# Patient Record
Sex: Female | Born: 1995 | Race: White | Hispanic: Yes | Marital: Single | Smoking: Never smoker
Health system: Southern US, Community
[De-identification: ages and names within clinical notes are randomized; demographics above are authoritative.]

## PROBLEM LIST (undated history)

## (undated) ENCOUNTER — Inpatient Hospital Stay (HOSPITAL_COMMUNITY): Payer: Self-pay

## (undated) DIAGNOSIS — N12 Tubulo-interstitial nephritis, not specified as acute or chronic: Secondary | ICD-10-CM

## (undated) HISTORY — DX: Tubulo-interstitial nephritis, not specified as acute or chronic: N12

---
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
## 2015-04-30 LAB — OB RESULTS CONSOLE ABO/RH: RH TYPE: POSITIVE

## 2015-04-30 LAB — OB RESULTS CONSOLE RUBELLA ANTIBODY, IGM: Rubella: IMMUNE

## 2015-04-30 LAB — OB RESULTS CONSOLE ANTIBODY SCREEN: ANTIBODY SCREEN: NEGATIVE

## 2015-04-30 LAB — OB RESULTS CONSOLE GC/CHLAMYDIA
Chlamydia: NEGATIVE
GC PROBE AMP, GENITAL: NEGATIVE

## 2015-04-30 LAB — OB RESULTS CONSOLE RPR: RPR: NONREACTIVE

## 2015-04-30 LAB — OB RESULTS CONSOLE HEPATITIS B SURFACE ANTIGEN: Hepatitis B Surface Ag: NEGATIVE

## 2015-04-30 LAB — OB RESULTS CONSOLE HIV ANTIBODY (ROUTINE TESTING): HIV: NONREACTIVE

## 2015-05-03 ENCOUNTER — Other Ambulatory Visit (HOSPITAL_COMMUNITY): Payer: Self-pay | Admitting: Nurse Practitioner

## 2015-05-03 DIAGNOSIS — Z3689 Encounter for other specified antenatal screening: Secondary | ICD-10-CM

## 2015-05-03 DIAGNOSIS — Z3A19 19 weeks gestation of pregnancy: Secondary | ICD-10-CM

## 2015-05-16 ENCOUNTER — Other Ambulatory Visit (HOSPITAL_COMMUNITY): Payer: Self-pay | Admitting: Nurse Practitioner

## 2015-05-16 ENCOUNTER — Ambulatory Visit (HOSPITAL_COMMUNITY)
Admission: RE | Admit: 2015-05-16 | Discharge: 2015-05-16 | Disposition: A | Discharge status: Home / Self Care | Payer: Self-pay | Source: Ambulatory Visit | Attending: Nurse Practitioner | Admitting: Nurse Practitioner

## 2015-05-16 ENCOUNTER — Encounter (HOSPITAL_COMMUNITY): Payer: Self-pay

## 2015-05-16 DIAGNOSIS — Z3491 Encounter for supervision of normal pregnancy, unspecified, first trimester: Secondary | ICD-10-CM

## 2015-05-16 DIAGNOSIS — Z3A19 19 weeks gestation of pregnancy: Secondary | ICD-10-CM

## 2015-05-16 DIAGNOSIS — Z3689 Encounter for other specified antenatal screening: Secondary | ICD-10-CM

## 2015-05-16 DIAGNOSIS — Z3A13 13 weeks gestation of pregnancy: Secondary | ICD-10-CM | POA: Insufficient documentation

## 2015-07-10 IMAGING — US US OB COMP +14 WK
1 series · 12 of 28 positions shown · non-contrast
Comparison: none

OBSTETRICS REPORT
                      (Signed Final 09/24/2013 [DATE])

Service(s) Provided
 US OB COMP + 14 WK                                    76805.1
Indications
 Unsure of LMP;  Establish Gestational [AGE]
 Acute Pyelonephritis
 Basic anatomic survey
Fetal Evaluation
 Num Of Fetuses:    1
 Fetal Heart Rate:  136                         bpm
 Cardiac Activity:  Observed
 Presentation:      Cephalic
 Placenta:          Anterior, above cervical os
 P. Cord            Visualized
 Insertion:
 Amniotic Fluid
 AFI FV:      Subjectively within normal limits
                                             Larg Pckt:     5.7  cm
Biometry
 BPD:     43.4  mm    G. Age:   19w 1d                CI:        73.38   70 - 86
                                                      FL/HC:      16.8   16.1 -
 HC:       161  mm    G. Age:   18w 6d       45  %    HC/AC:      1.18   1.09 -
 AC:     135.9  mm    G. Age:   19w 0d       52  %    FL/BPD:
 FL:      27.1  mm    G. Age:   18w 2d       23  %    FL/AC:      19.9   20 - 24
 HUM:       26  mm    G. Age:   18w 1d       32  %
 CER:       20  mm    G. Age:   19w 0d       56  %
 NFT:     2.97  mm
 Est. FW:     254  gm      0 lb 9 oz     42  %
Gestational Age
 U/S Today:     18w 6d                                        EDD:   02/19/14
 Best:          18w 6d    Det. By:   U/S (09/24/13)           EDD:   02/19/14
Anatomy
 Cranium:          Appears normal         Aortic Arch:      Appears normal
 Fetal Cavum:      Appears normal         Ductal Arch:      Not well visualized
 Ventricles:       Appears normal         Diaphragm:        Appears normal
 Choroid Plexus:   Appears normal         Stomach:          Appears normal, left
                                                            sided
 Cerebellum:       Appears normal         Abdomen:          Appears normal
 Posterior Fossa:  Appears normal         Abdominal Wall:   Appears nml (cord
                                                            insert, abd wall)
 Nuchal Fold:      Appears normal         Cord Vessels:     Appears normal (3
                                                            vessel cord)
 Face:             Appears normal         Kidneys:          Appear normal
                   (orbits and profile)
 Lips:             Appears normal         Bladder:          Appears normal
 Palate:           Not well visualized    Spine:            Appears normal
 Heart:            Appears normal         Lower             Visualized
                   (4CH, axis, and        Extremities:
                   situs)
 RVOT:             Not well visualized    Upper             Visualized
                                          Extremities:
 LVOT:             Not well visualized
 Other:  Gender not well visualized. Heels, Left 5th digit, and Nasal bone
         visualized.
Targeted Anatomy
 Fetal Central Nervous System
 Lat. Ventricles:  7.8                    Cisterna Magna:
Cervix Uterus Adnexa
 Cervical Length:   2.7       cm
 Cervix:       Normal appearance by transabdominal scan.
 Left Ovary:   Size(cm) L: 3.38 x W: 2.05 x H: 1.93  Volume(cc): 7
 Right Ovary:  Size(cm) L: 3.68 x W: 2.41 x H: 1.88  Volume(cc):
 Adnexa:     No abnormality visualized.
Impression
INDICATION: 17 yr old G1P0 with unknown dating and
 pyelonephritis for fetal ultrasound. Remote read.

[Series 1: us ob comp +14 wk · 12 of 76 slices shown]
[im 3/76]
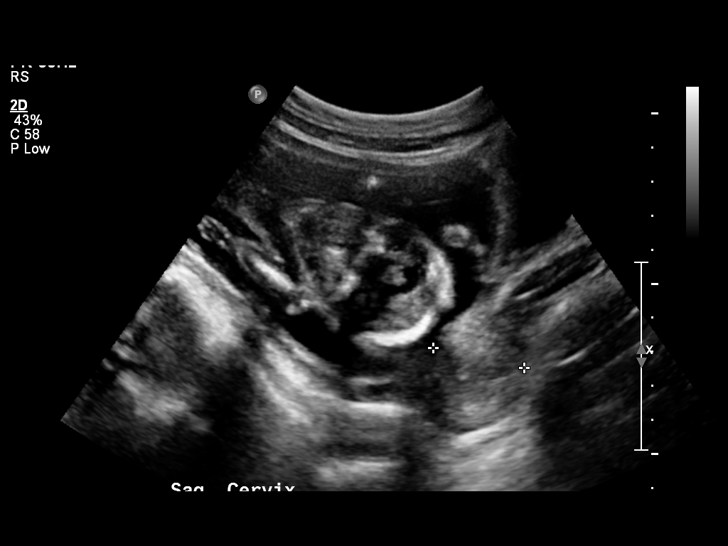
[im 9/76]
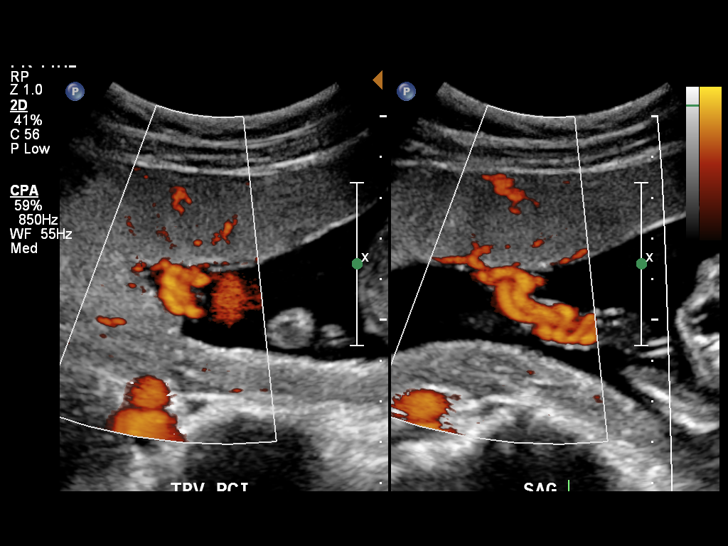
[im 14/76]
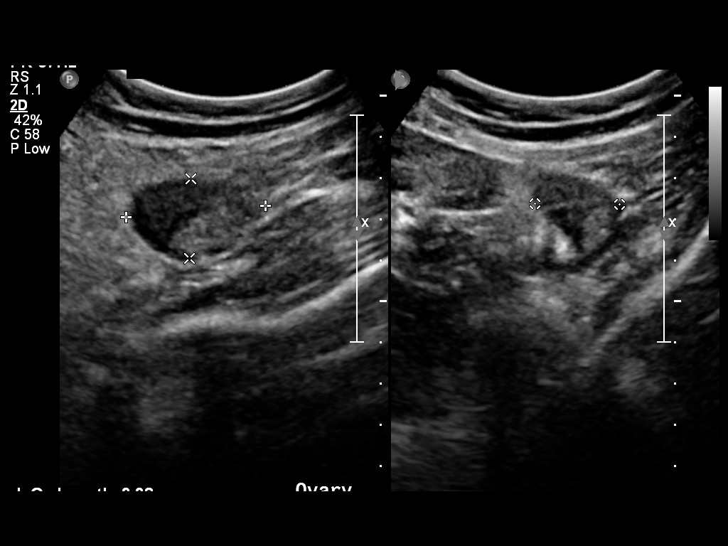
[im 23/76]
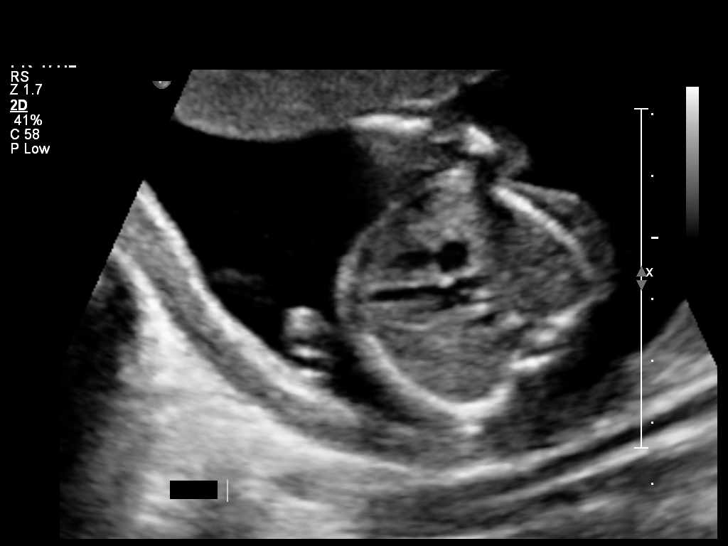
[im 28/76]
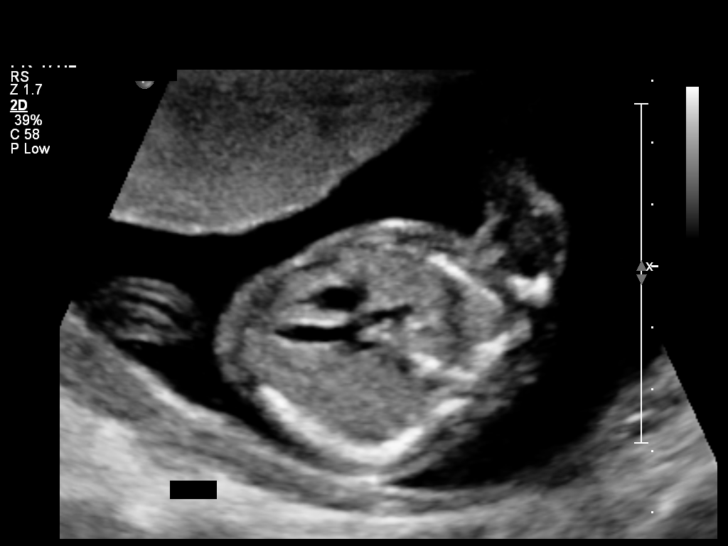
[im 34/76]
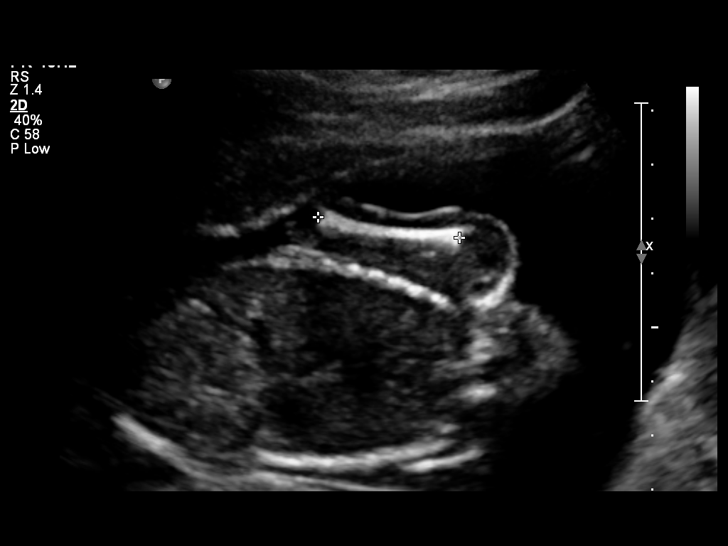
[im 42/76]
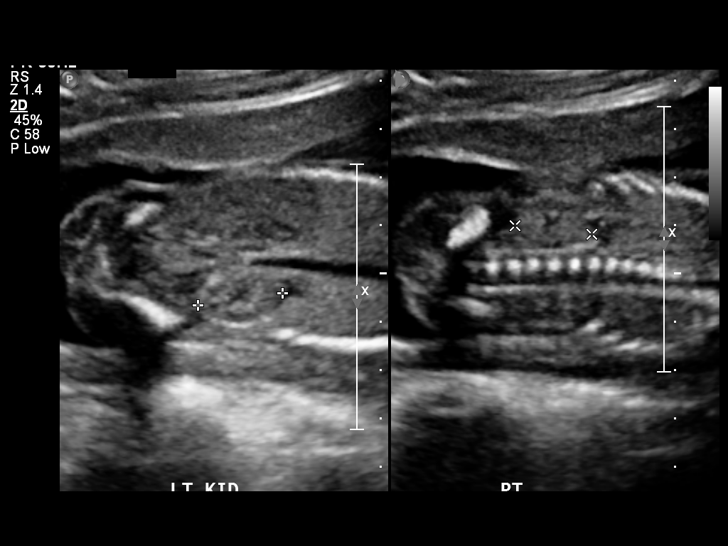
[im 48/76]
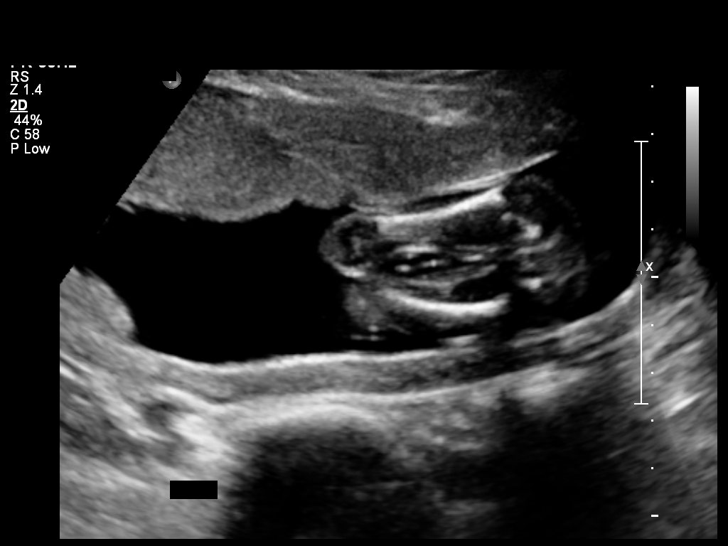
[im 53/76]
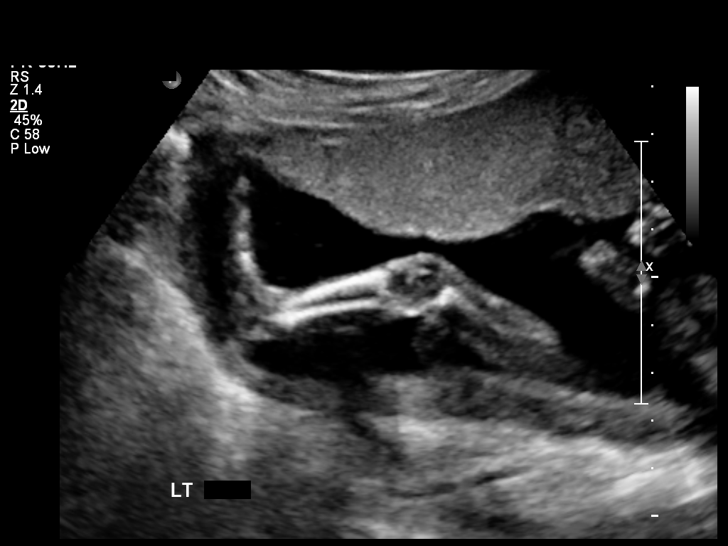
[im 62/76]
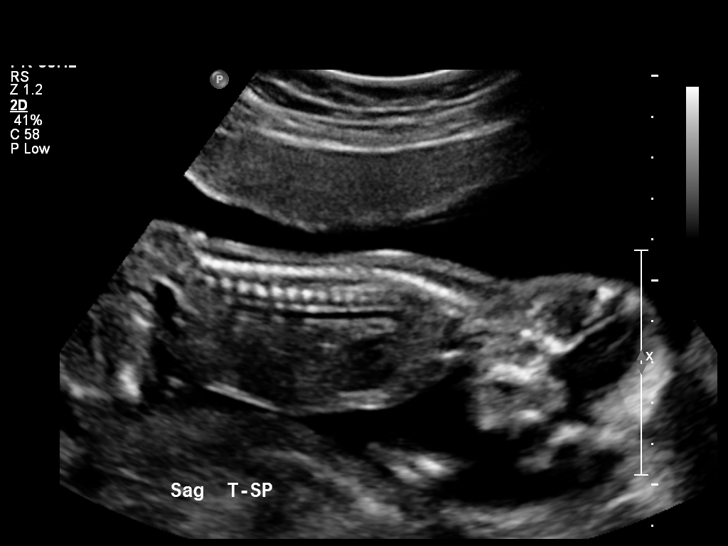
[im 67/76]
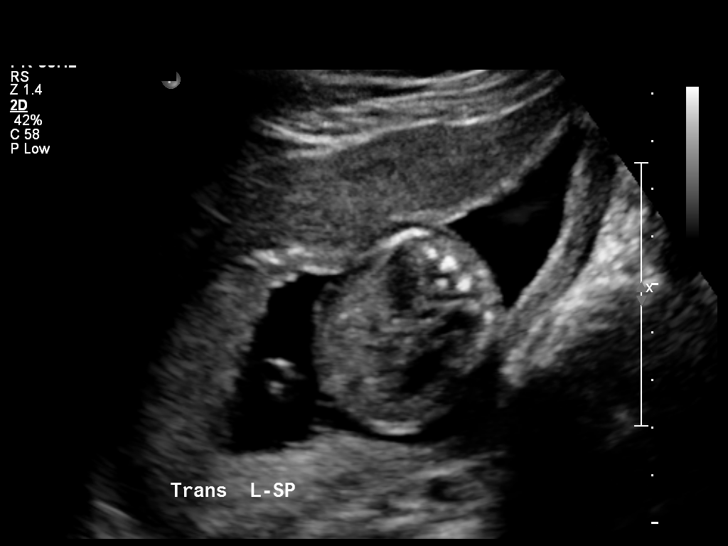
[im 73/76]
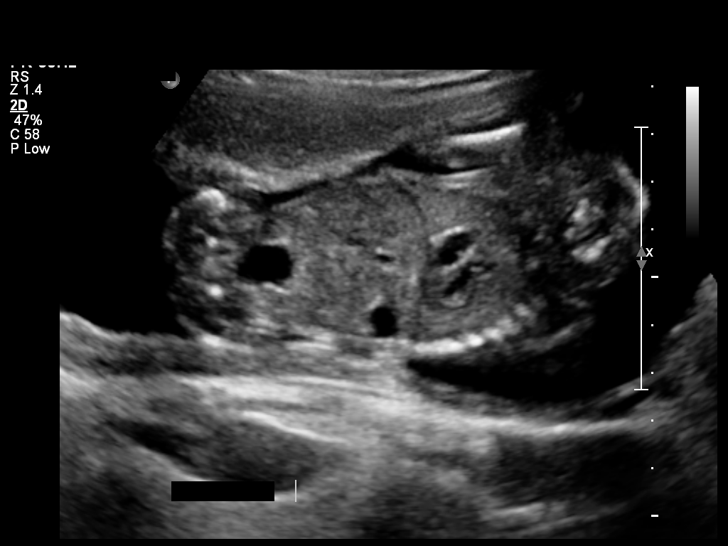

[12 of 28 positions shown; findings below may reference images not displayed]

FINDINGS: 1. Single intrauterine pregnancy.
 2. Fetal biometry is consistent with 52w3d.
 3. Anterior placenta without evidence of previa.
 4. Normal amniotic fluid volume.
 5. Normal transabdominal cervical length; although length is
 on the low end of normal.
 6. The views of the palate and heart are limited.
 7. The remainder of the limited anatomy survey is normal.
Recommendations

 1. Based on today's ultrasound recommend using an
 estimated due date of 02/19/14.
 [DATE]. Limited anatomy survey:
 - recommend follow up in 3-4 weeks to complete anatomic
 survey
 3. Slightly decreased cervical length:
 - recommend obtain transvaginal cervical length
 4. Pyelonephritis:
 - management per primary OB team

## 2015-07-22 NOTE — L&D Delivery Note (Signed)
Delivery Note Admitted in active labor and rapidly progressed to 10cm w/ bbow. SROM clear fluid when began to push. At 2:45 AM a viable female was delivered via Vaginal, Spontaneous Delivery (Presentation: Left Occiput Anterior) w/ loose nuchal- delivered through and immediately reduced after birth. ~<30sec shoulder dystocia resolved by McRoberts and rotation of posterior shoulder to oblique with subsequent delivery of anterior (Rt) shoulder and remainder of body.  APGAR: 8, 9; weight: pending at time of note. Infant w/ spontaneous cry/respiration placed directly on mom's abdomen for bonding/skin-to-skin. Delayed cord clamping, then cord clamped x 2, and cut by fob.  Appears to be moving bilateral arms symmetrically and w/o difficulty.    Placenta status: Intact, Spontaneous.  Cord: 3 vessels with the following complications: None.  Cord pH: not collected  Anesthesia: Local Episiotomy: None Lacerations: Rt deep/long Periurethral Suture Repair: 4.0 vicryl Est. Blood Loss (mL): 50  Mom to postpartum.  Baby to Couplet care / Skin to Skin. Plans to pump and bottlefeed, depo for contraception, no circ  Marge DuncansBooker, Peighton Mehra Randall 11/18/2015, 3:13 AM

## 2015-09-03 IMAGING — US US OB FOLLOW-UP
2 series · 12 of 28 positions shown · non-contrast
Comparison: none

OBSTETRICS REPORT
                      (Signed Final 11/18/2013 [DATE])

Service(s) Provided
 US OB FOLLOW UP                                       76816.1
Indications
 Follow-up incomplete fetal anatomic evaluation
Fetal Evaluation
 Num Of Fetuses:    1
 Fetal Heart Rate:  150                          bpm
 Cardiac Activity:  Observed
 Presentation:      Cephalic
 Placenta:          Anterior, above cervical os
 P. Cord            Previously Visualized
 Insertion:
 Amniotic Fluid
 AFI FV:      Subjectively within normal limits
                                             Larg Pckt:     4.1  cm
Biometry
 BPD:     64.9  mm     G. Age:  26w 2d                CI:         73.3   70 - 86
                                                      FL/HC:      19.1   18.6 -
 HC:     240.9  mm     G. Age:  26w 1d       11  %    HC/AC:      1.10   1.05 -
 AC:     219.7  mm     G. Age:  26w 3d       33  %    FL/BPD:     70.9   71 - 87
 FL:        46  mm     G. Age:  25w 2d        7  %    FL/AC:      20.9   20 - 24
 HUM:     42.9  mm     G. Age:  25w 5d       22  %
 Est. FW:     876  gm    1 lb 15 oz      37  %
Gestational Age
 U/S Today:     26w 0d                                        EDD:   02/24/14
 Best:          26w 5d     Det. By:  U/S (09/24/13)           EDD:   02/19/14
Anatomy
 Cranium:          Appears normal         Aortic Arch:      Previously seen
 Fetal Cavum:      Previously seen        Ductal Arch:      Not well visualized
 Ventricles:       Appears normal         Diaphragm:        Previously seen
 Choroid Plexus:   Previously seen        Stomach:          Appears normal, left
                                                            sided
 Cerebellum:       Previously seen        Abdomen:          Appears normal
 Posterior Fossa:  Previously seen        Abdominal Wall:   Previously seen
 Nuchal Fold:      Not applicable (>20    Cord Vessels:     Previously seen
                   wks GA)
 Face:             Orbits and profile     Kidneys:          Appear normal
                   previously seen
 Lips:             Previously seen        Bladder:          Appears normal
 Heart:            Appears normal         Spine:            Previously seen
                   (4CH, axis, and
                   situs)
 RVOT:             Appears normal         Lower             Previously seen
                                          Extremities:
 LVOT:             Appears normal         Upper             Previously seen
 Other:  Fetus appears to be a female.. Heels, Left 5th digit, and Nasal bone
         previously visualized.
Targeted Anatomy
 Fetal Central Nervous System
 Lat. Ventricles:
Cervix Uterus Adnexa
 Cervical Length:    3.04     cm
 Cervix:       Normal appearance by transabdominal scan.
 Uterus:       No abnormality visualized.
 Left Ovary:    Not visualized.
 Right Ovary:   Not visualized.
 Adnexa:     No abnormality No adnexal mass visualized.
Impression
INDICATION: 17 yr old G1P0 at 15w3d for follow up attempt to
 complete fetal survey. Remote read.

[Series 1: us ob follow up · 11 of 44 slices shown (1 of 2)]
[im 2/44]
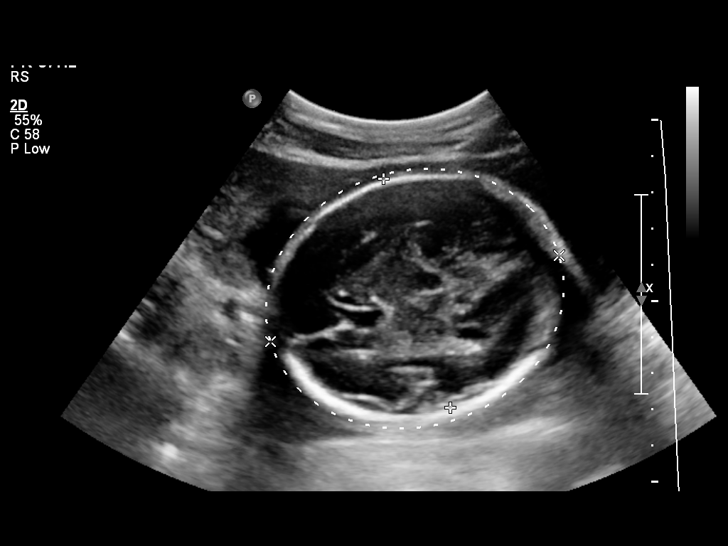
[im 6/44]
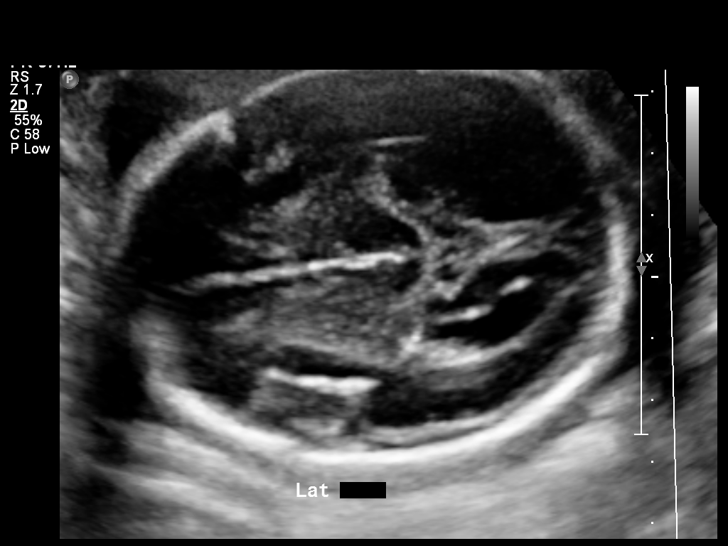
[im 9/44]
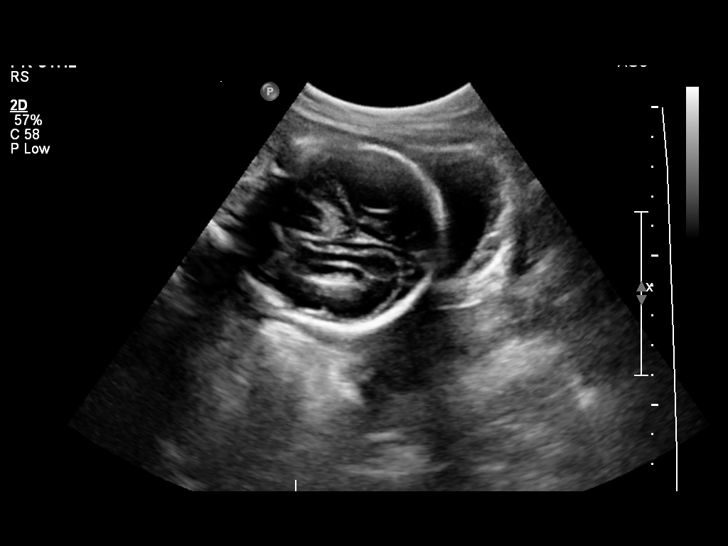
[im 14/44]
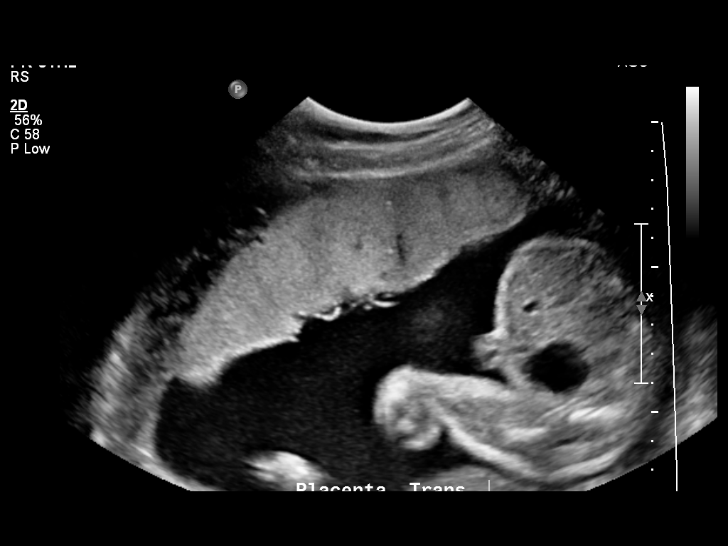
[im 18/44]
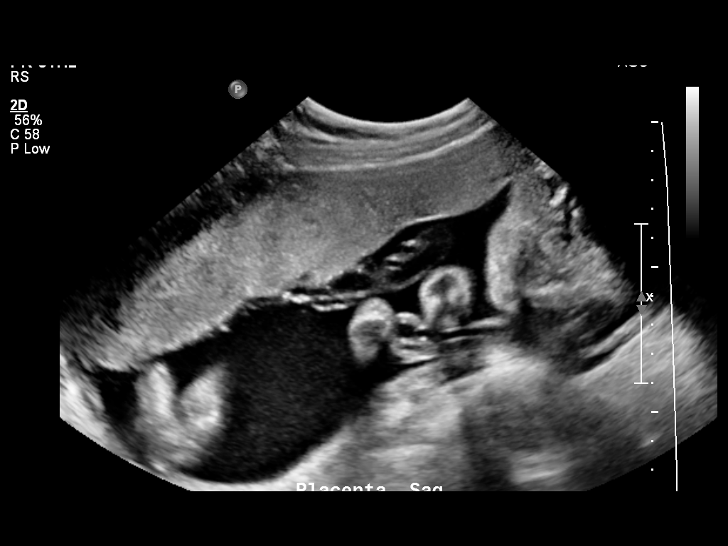
[im 21/44]
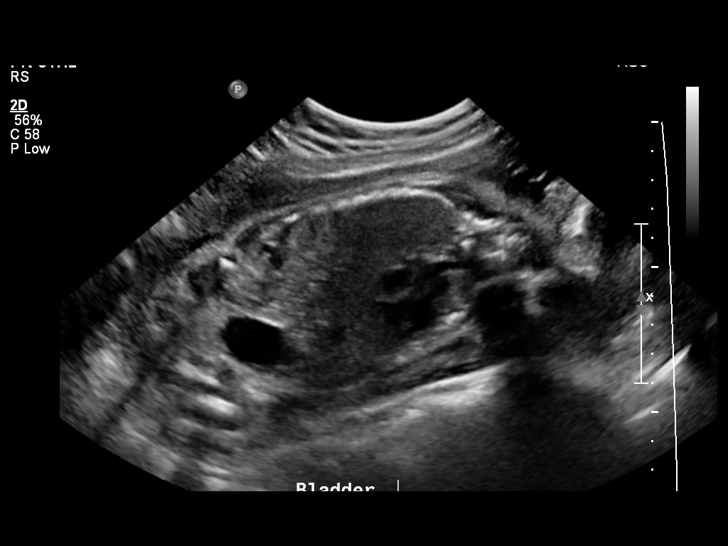
[im 26/44]
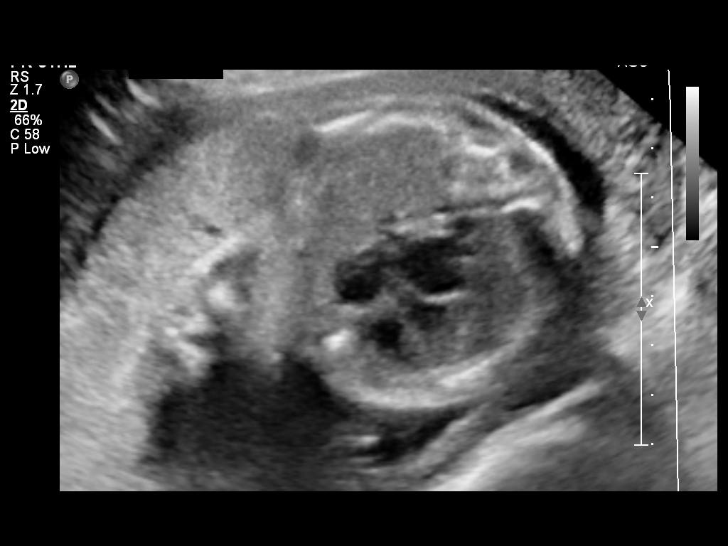
[im 30/44]
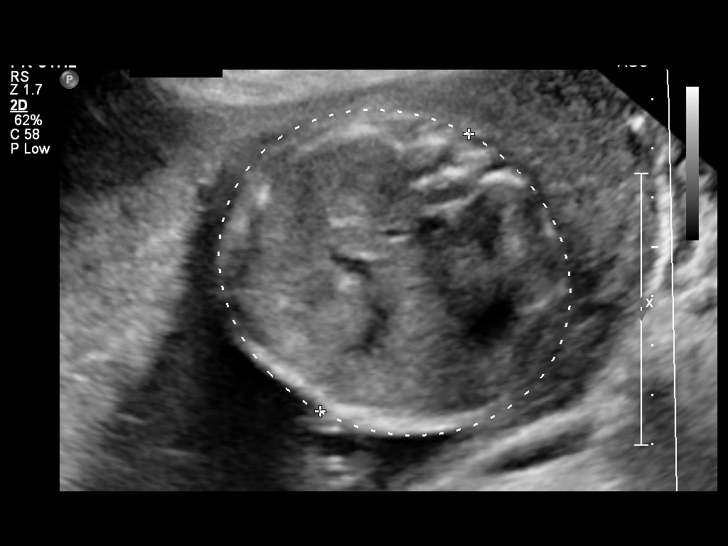
[im 33/44]
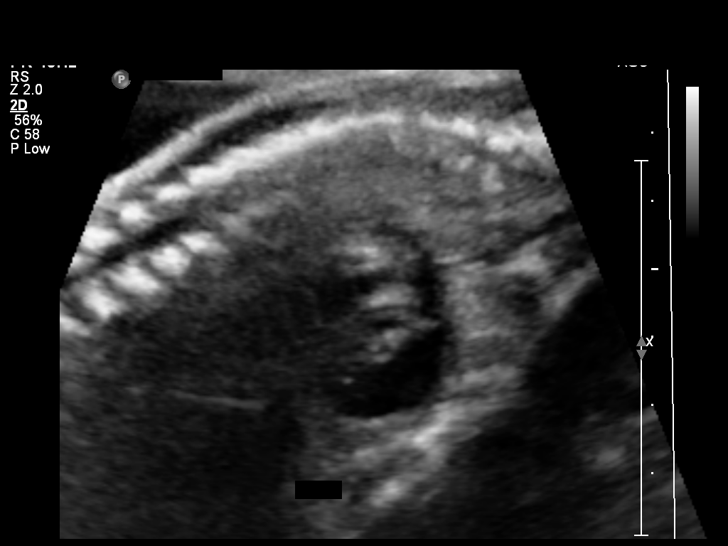
[im 38/44]
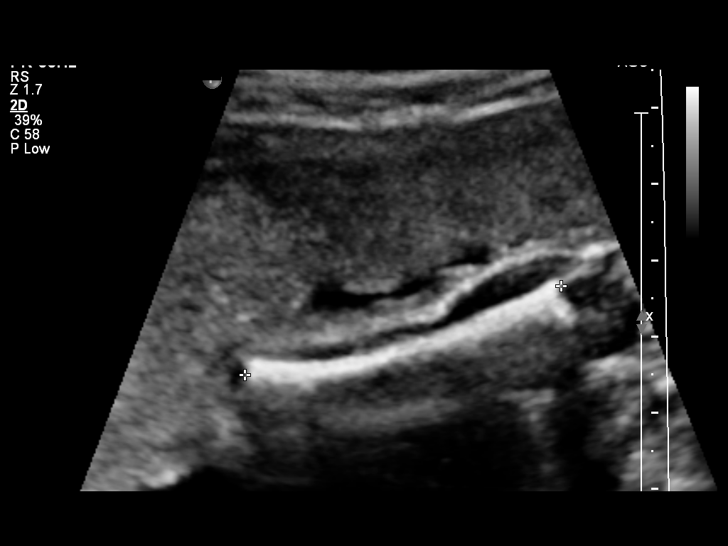
[im 42/44]
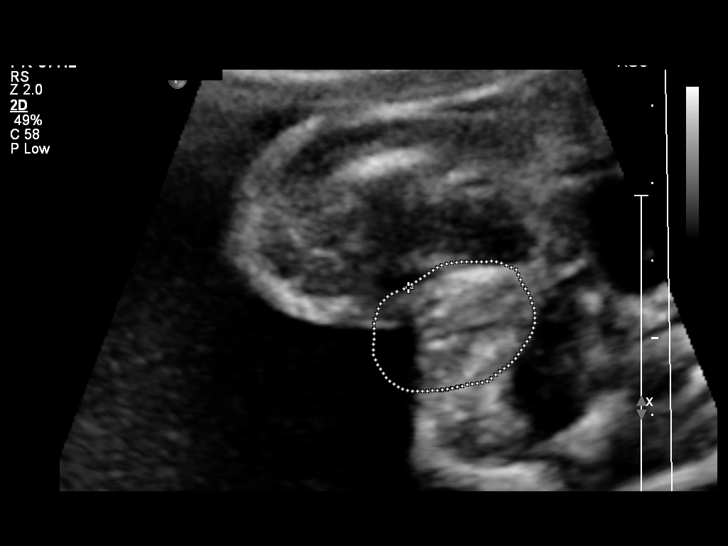

[Series 1: us ob follow up · 1 of 3 slices shown (2 of 2)]
[im 1/3]
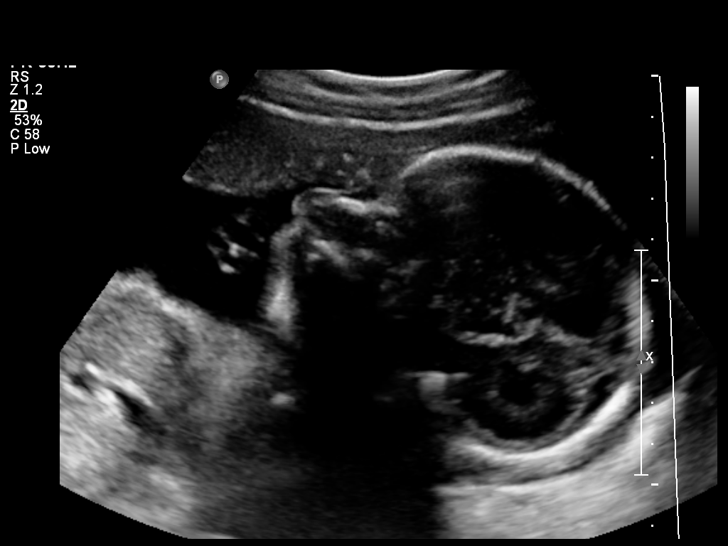

[12 of 28 positions shown; findings below may reference images not displayed]

FINDINGS: 1. Active single intrauterine pregnancy in cephalic
 presentation
 2. EFW 37th% with appropriate interval growth
 3. Anterior placenta without evidence of previa.
 4. Today's images effectively complete essential components
 of fetal survey without apparent dysmorphic features
Recommendations

 Follow up ultrasounds as clinically indicated.

## 2015-10-26 LAB — OB RESULTS CONSOLE GBS: STREP GROUP B AG: NEGATIVE

## 2015-11-18 ENCOUNTER — Inpatient Hospital Stay (HOSPITAL_COMMUNITY)
Admission: AD | Admit: 2015-11-18 | Discharge: 2015-11-19 | DRG: 775 | Disposition: A | Discharge status: Home / Self Care | Payer: Medicaid Other | Source: Ambulatory Visit | Attending: Obstetrics and Gynecology | Admitting: Obstetrics and Gynecology

## 2015-11-18 ENCOUNTER — Encounter (HOSPITAL_COMMUNITY): Payer: Self-pay | Admitting: *Deleted

## 2015-11-18 DIAGNOSIS — Z3A40 40 weeks gestation of pregnancy: Secondary | ICD-10-CM | POA: Diagnosis not present

## 2015-11-18 DIAGNOSIS — IMO0001 Reserved for inherently not codable concepts without codable children: Secondary | ICD-10-CM

## 2015-11-18 LAB — CBC
HEMATOCRIT: 33.3 % — AB (ref 36.0–46.0)
HEMOGLOBIN: 11 g/dL — AB (ref 12.0–15.0)
MCH: 23.8 pg — ABNORMAL LOW (ref 26.0–34.0)
MCHC: 33 g/dL (ref 30.0–36.0)
MCV: 72.1 fL — ABNORMAL LOW (ref 78.0–100.0)
Platelets: 222 10*3/uL (ref 150–400)
RBC: 4.62 MIL/uL (ref 3.87–5.11)
RDW: 16.9 % — ABNORMAL HIGH (ref 11.5–15.5)
WBC: 8.4 10*3/uL (ref 4.0–10.5)

## 2015-11-18 LAB — TYPE AND SCREEN
ABO/RH(D): O POS
ANTIBODY SCREEN: NEGATIVE

## 2015-11-18 LAB — HIV ANTIBODY (ROUTINE TESTING W REFLEX): HIV SCREEN 4TH GENERATION: NONREACTIVE

## 2015-11-18 LAB — ABO/RH: ABO/RH(D): O POS

## 2015-11-18 LAB — RPR: RPR Ser Ql: NONREACTIVE

## 2015-11-18 MED ORDER — ACETAMINOPHEN 325 MG PO TABS: 650.0000 mg | ORAL_TABLET | ORAL | Status: DC | PRN

## 2015-11-18 MED ORDER — WITCH HAZEL-GLYCERIN EX PADS: 1.0000 "application " | MEDICATED_PAD | CUTANEOUS | Status: DC | PRN

## 2015-11-18 MED ORDER — PRENATAL MULTIVITAMIN CH
1.0000 | ORAL_TABLET | Freq: Every day | ORAL | Status: DC
  Administered 2015-11-18: 1 via ORAL
  Filled 2015-11-18: qty 1

## 2015-11-18 MED ORDER — SENNOSIDES-DOCUSATE SODIUM 8.6-50 MG PO TABS
2.0000 | ORAL_TABLET | ORAL | Status: DC
  Administered 2015-11-18: 2 via ORAL

## 2015-11-18 MED ORDER — LACTATED RINGERS IV SOLN
INTRAVENOUS | Status: DC
  Administered 2015-11-18: 03:00:00 via INTRAVENOUS

## 2015-11-18 MED ORDER — SIMETHICONE 80 MG PO CHEW: 80.0000 mg | CHEWABLE_TABLET | ORAL | Status: DC | PRN

## 2015-11-18 MED ORDER — OXYTOCIN 10 UNIT/ML IJ SOLN
INTRAVENOUS | Status: AC
  Filled 2015-11-18: qty 4

## 2015-11-18 MED ORDER — BENZOCAINE-MENTHOL 20-0.5 % EX AERO
1.0000 | INHALATION_SPRAY | CUTANEOUS | Status: DC | PRN
Start: 2015-11-18 — End: 2015-11-19

## 2015-11-18 MED ORDER — OXYTOCIN BOLUS FROM INFUSION
500.0000 mL | INTRAVENOUS | Status: DC
Start: 2015-11-18 — End: 2015-11-19
  Administered 2015-11-18: 500 mL via INTRAVENOUS

## 2015-11-18 MED ORDER — LIDOCAINE HCL (PF) 1 % IJ SOLN
30.0000 mL | INTRAMUSCULAR | Status: DC | PRN
  Administered 2015-11-18: 30 mL via SUBCUTANEOUS
  Filled 2015-11-18: qty 30

## 2015-11-18 MED ORDER — DIPHENHYDRAMINE HCL 25 MG PO CAPS: 25.0000 mg | ORAL_CAPSULE | Freq: Four times a day (QID) | ORAL | Status: DC | PRN

## 2015-11-18 MED ORDER — FLEET ENEMA 7-19 GM/118ML RE ENEM: 1.0000 | ENEMA | RECTAL | Status: DC | PRN

## 2015-11-18 MED ORDER — SODIUM CHLORIDE 0.9 % IV SOLN: 250.0000 mL | INTRAVENOUS | Status: DC | PRN

## 2015-11-18 MED ORDER — ONDANSETRON HCL 4 MG/2ML IJ SOLN: 4.0000 mg | INTRAMUSCULAR | Status: DC | PRN

## 2015-11-18 MED ORDER — ZOLPIDEM TARTRATE 5 MG PO TABS: 5.0000 mg | ORAL_TABLET | Freq: Every evening | ORAL | Status: DC | PRN

## 2015-11-18 MED ORDER — FLEET ENEMA 7-19 GM/118ML RE ENEM: 1.0000 | ENEMA | Freq: Every day | RECTAL | Status: DC | PRN

## 2015-11-18 MED ORDER — ONDANSETRON HCL 4 MG/2ML IJ SOLN: 4.0000 mg | Freq: Four times a day (QID) | INTRAMUSCULAR | Status: DC | PRN

## 2015-11-18 MED ORDER — IBUPROFEN 600 MG PO TABS
600.0000 mg | ORAL_TABLET | Freq: Four times a day (QID) | ORAL | Status: DC
  Administered 2015-11-18 – 2015-11-19 (×5): 600 mg via ORAL
  Filled 2015-11-18 (×3): qty 1

## 2015-11-18 MED ORDER — LIDOCAINE HCL (PF) 1 % IJ SOLN
INTRAMUSCULAR | Status: AC
  Administered 2015-11-18: 30 mL via SUBCUTANEOUS
  Filled 2015-11-18: qty 30

## 2015-11-18 MED ORDER — TETANUS-DIPHTH-ACELL PERTUSSIS 5-2.5-18.5 LF-MCG/0.5 IM SUSP: 0.5000 mL | Freq: Once | INTRAMUSCULAR | Status: DC

## 2015-11-18 MED ORDER — FENTANYL CITRATE (PF) 100 MCG/2ML IJ SOLN: 50.0000 ug | INTRAMUSCULAR | Status: DC | PRN

## 2015-11-18 MED ORDER — OXYTOCIN 10 UNIT/ML IJ SOLN: 2.5000 [IU]/h | INTRAVENOUS | Status: DC

## 2015-11-18 MED ORDER — MEASLES, MUMPS & RUBELLA VAC ~~LOC~~ INJ
0.5000 mL | INJECTION | Freq: Once | SUBCUTANEOUS | Status: DC
  Filled 2015-11-18: qty 0.5

## 2015-11-18 MED ORDER — SODIUM CHLORIDE 0.9% FLUSH: 3.0000 mL | Freq: Two times a day (BID) | INTRAVENOUS | Status: DC

## 2015-11-18 MED ORDER — ONDANSETRON HCL 4 MG PO TABS: 4.0000 mg | ORAL_TABLET | ORAL | Status: DC | PRN

## 2015-11-18 MED ORDER — CITRIC ACID-SODIUM CITRATE 334-500 MG/5ML PO SOLN: 30.0000 mL | ORAL | Status: DC | PRN

## 2015-11-18 MED ORDER — DIBUCAINE 1 % RE OINT: 1.0000 "application " | TOPICAL_OINTMENT | RECTAL | Status: DC | PRN

## 2015-11-18 MED ORDER — SODIUM CHLORIDE 0.9% FLUSH: 3.0000 mL | INTRAVENOUS | Status: DC | PRN

## 2015-11-18 MED ORDER — LACTATED RINGERS IV SOLN: 500.0000 mL | INTRAVENOUS | Status: DC | PRN

## 2015-11-18 MED ORDER — HYDROXYZINE HCL 50 MG PO TABS
50.0000 mg | ORAL_TABLET | Freq: Four times a day (QID) | ORAL | Status: DC | PRN
  Filled 2015-11-18: qty 1

## 2015-11-18 MED ORDER — OXYCODONE-ACETAMINOPHEN 5-325 MG PO TABS: 1.0000 | ORAL_TABLET | ORAL | Status: DC | PRN

## 2015-11-18 MED ORDER — BISACODYL 10 MG RE SUPP: 10.0000 mg | Freq: Every day | RECTAL | Status: DC | PRN

## 2015-11-18 MED ORDER — OXYCODONE-ACETAMINOPHEN 5-325 MG PO TABS: 2.0000 | ORAL_TABLET | ORAL | Status: DC | PRN

## 2015-11-18 MED ORDER — COCONUT OIL OIL: 1.0000 "application " | TOPICAL_OIL | Status: DC | PRN

## 2015-11-18 NOTE — MAU Note (Signed)
Contractions.  No bleeding. No leaking.  Baby moving well today.

## 2015-11-18 NOTE — Lactation Note (Signed)
This note was copied from a baby's chart. Lactation Consultation Note: In House Spanish Interpreter Eunice Blase(Debbie) at bedside for all basic teaching.  Mother states she breastfed her first child for one year, she states that she supplemented her while in the hospital until her milk came in.  Infant is 3711 hours old . Mother states that infant is breastfeeding but she doesn't think he is getting enough milk yet. Mother observed independently latching infant on in cradle hold. Infant sustained latch for 20-25 mins. Observed audible swallows. Mother request formula despite that infant is breastfeeding well and has a good deep latch. Mother advised to post pump and to limit use of formula.    Patient Name: [name] [name] Today's Date: 11/18/2015 Reason for consult: Initial assessment   Maternal Data Has patient been taught Hand Expression?: Yes Does the patient have breastfeeding experience prior to this delivery?: Yes  Feeding Feeding Type: Breast Fed Length of feed: 20 min  LATCH Score/Interventions Latch: Grasps breast easily, tongue down, lips flanged, rhythmical sucking.  Audible Swallowing: Spontaneous and intermittent Intervention(s): Skin to skin;Hand expression  Type of Nipple: Everted at rest and after stimulation  Comfort (Breast/Nipple): Soft / non-tender     Hold (Positioning): No assistance needed to correctly position infant at breast. Intervention(s): Breastfeeding basics reviewed;Support Pillows;Position options;Skin to skin  LATCH Score: 10  Lactation Tools Discussed/Used     Consult Status Consult Status: Follow-up Date: 11/18/15 Follow-up type: In-patient    Stevan BornKendrick, Eldred Sooy [facility] 11/18/2015, 2:34 PM

## 2015-11-18 NOTE — Progress Notes (Signed)
Assisted RN with interpretation of medical questions.  Spanish Interpreter

## 2015-11-18 NOTE — Plan of Care (Signed)
Problem: Coping: Goal: Ability to identify and utilize available resources and services will improve Outcome: Progressing [facility]

## 2015-11-18 NOTE — H&P (Signed)
[name] [name] is a 20 y.o. G2P1001 female at 5940.2 by 13wk u/s, presenting in active labor.   Reports active fetal movement, contractions: regular, vaginal bleeding: none, membranes: intact. [facility]    This pregnancy complicated by: Language barrier- spanish  Prenatal History/Complications:  Term uncomplicated svb x 1  Past Medical History: Past Medical History  Diagnosis Date  . Pyelonephritis 2015    Past Surgical History: Past Surgical History  Procedure Laterality Date  . Thoracentesis Right 09/29/13    Obstetrical History: OB History    Gravida Para Term Preterm AB TAB SAB Ectopic Multiple Living   2 1 1       1       Social History: Social History   Social History  . Marital Status: Single    Spouse Name: N/A  . Number of Children: N/A  . Years of Education: N/A   Social History Main Topics  . Smoking status: Never Smoker   . Smokeless tobacco: Never Used  . Alcohol Use: No  . Drug Use: No  . Sexual Activity: No   Other Topics Concern  . Not on file   Social History Narrative    Family History: No family history on file.  Allergies: No Known Allergies  Prescriptions prior to admission  Medication Sig Dispense Refill Last Dose  . ibuprofen (ADVIL,MOTRIN) 600 MG tablet Take 1 tablet (600 mg total) by mouth every 6 (six) hours as needed (pain scale < 4). 30 tablet 0   . norethindrone (ORTHO MICRONOR) 0.35 MG tablet Take 1 tablet (0.35 mg total) by mouth daily. 1 Package 11   . Prenatal Vit-Fe Fumarate-FA (PRENATAL MULTIVITAMIN) TABS tablet Take 1 tablet by mouth daily at 12 noon.   02/13/2014 at Unknown time    Review of Systems  Pertinent pos/neg as indicated in HPI  Temperature 98.2 F (36.8 C), temperature source Oral, resp. rate 18, last menstrual period 01/03/2015, unknown if currently breastfeeding. General appearance: alert and cooperative Lungs: clear to auscultation bilaterally Heart:  regular rate and rhythm Abdomen: gravid, soft, non-tender Extremities: trace edema DTR's 2+  Fetal monitoring: FHR: 145 bpm, variability: moderate,  Accelerations: Present,  decelerations:  Absent Uterine activity: regular  Dilation: 6.5 Effacement (%): 80 Station: -1 Exam by:: Quintella BatonJo Barham RNC Presentation: cephalic   Prenatal labs: ABO, Rh:  O+ Antibody:  neg Rubella: immune RPR:   non-reactive HBsAg:   neg HIV:   neg GBS:   neg  1 hr Glucola: 97 Genetic screening:  Quad neg Anatomy US: small echogenic foci, otherwise normal  No results found for this or any previous visit (from the past 24 hour(s)).   Assessment:  Unknown SIUP  G2P1001  Active labor  Cat 1 FHR  GBS  neg  Plan:  Admit to BS  IV pain meds/epidural prn active labor  Expectant management  Anticipate NSVB   Plans to pump and bottlefeed  Contraception: depo  Circumcision: declines  Marge DuncansBooker, Steffie Waggoner Randall CNM, WHNP-BC 11/18/2015, 2:26 AM

## 2015-11-18 NOTE — Progress Notes (Addendum)
Checked on patients needs. Ordered patients dinner and late snack.  Spanish Interpreter

## 2015-11-19 MED ORDER — IBUPROFEN 600 MG PO TABS: 600.0000 mg | ORAL_TABLET | Freq: Four times a day (QID) | ORAL | Status: AC

## 2015-11-19 NOTE — Progress Notes (Signed)
UR chart review completed.  

## 2015-11-19 NOTE — Discharge Summary (Signed)
OB Discharge Summary  Patient Name: [name] [name] DOB: 21-Feb-1996 MRN: [id number]  Date of admission: 11/18/2015 Delivering MD: Shawna ClampBOOKER, KIMBERLY R   Date of discharge: 11/19/2015  Admitting diagnosis: 40wks, contractions Intrauterine pregnancy: 3538w2d     Secondary diagnosis:Active Problems:   Vaginal delivery  Additional problems:shoulder dystocia     Discharge diagnosis: Term Pregnancy Delivered                                                                     Post partum procedures:none  Augmentation: Pitocin  Complications: None  Hospital course:  Onset of Labor With Vaginal Delivery     20 y.o. yo G2P2002 at 7438w2d was admitted in Active Labor on 11/18/2015. Patient had an uncomplicated labor course as follows:  Membrane Rupture Time/Date: 2:42 AM ,11/18/2015   Intrapartum Procedures: Episiotomy: None [1]                                         Lacerations:  Periurethral [8]  Patient had a delivery of a Viable infant. 11/18/2015  Information for the patient's newborn:  [name] [name], [name] [name] [[id number]][[id number]]  Delivery Method: Vaginal, Spontaneous Delivery (Filed from Delivery Summary)    Pateint had an uncomplicated postpartum course.  She is ambulating, tolerating a regular diet, passing flatus, and urinating well. Patient is discharged home in stable condition on 11/19/2015.    Physical exam  Filed Vitals:   11/18/15 0600 11/18/15 0841 11/18/15 1802 11/19/15 0528  BP: 99/66 107/61 98/57 121/70  Pulse: 74 66 66 67  Temp: 98.8 F (37.1 C) 98.3 F (36.8 C) 98.2 F (36.8 C) 98.3 F (36.8 C)  TempSrc: Oral Oral Oral Oral  Resp: 18 17 18 16   Height:      Weight:      SpO2: 99%  100% 100%   General: alert, cooperative and no distress Lochia: appropriate Uterine Fundus: firm Incision: N/A DVT Evaluation: No evidence of DVT seen on physical exam. No cords or calf tenderness. No significant calf/ankle edema. Labs: Lab Results  Component  Value Date   WBC 8.4 11/18/2015   HGB 11.0* 11/18/2015   HCT 33.3* 11/18/2015   MCV 72.1* 11/18/2015   PLT 222 11/18/2015   CMP Latest Ref Rng 09/30/2013  Glucose 70 - 99 mg/dL 96  BUN 6 - 23 mg/dL 5(L)  Creatinine 9.620.47 - 1.00 mg/dL 9.520.64  Sodium 841137 - 324147 mEq/L 139  Potassium 3.7 - 5.3 mEq/L 3.3(L)  Chloride 96 - 112 mEq/L 108  CO2 19 - 32 mEq/L 21  Calcium 8.4 - 10.5 mg/dL 8.1(L)  Total Protein 6.0 - 8.3 g/dL 4.6(L)  Total Bilirubin 0.3 - 1.2 mg/dL 0.3  Alkaline Phos 47 - 119 U/L 284(H)  AST 0 - 37 U/L 92(H)  ALT 0 - 35 U/L 55(H)    Discharge instruction: per After Visit Summary and "Baby and Me Booklet".  After Visit Meds:    Medication List    Notice    You have not been prescribed any medications.      Diet: routine diet  Activity: Advance as tolerated. Pelvic rest for  6 weeks.   Outpatient follow up:6 weeks Follow up Appt:No future appointments. Follow up visit: No Follow-up on file.  Postpartum contraception: Depo Provera  Newborn Data: Live born female  Birth Weight: 8 lb 5.7 oz (3790 g) APGAR: 8, 9  Baby Feeding: Breast Disposition:home with mother   11/19/2015 Wyvonnia Dusky, CNM

## 2016-02-13 ENCOUNTER — Encounter: Payer: Self-pay | Admitting: Pediatrics

## 2016-02-14 ENCOUNTER — Encounter: Payer: Self-pay | Admitting: Pediatrics

## 2019-03-10 ENCOUNTER — Other Ambulatory Visit (HOSPITAL_COMMUNITY): Payer: Self-pay | Admitting: *Deleted

## 2019-03-10 ENCOUNTER — Encounter (HOSPITAL_COMMUNITY): Payer: Self-pay | Admitting: *Deleted

## 2019-03-10 DIAGNOSIS — N644 Mastodynia: Secondary | ICD-10-CM

## 2019-03-22 ENCOUNTER — Ambulatory Visit (HOSPITAL_COMMUNITY): Payer: Self-pay

## 2019-03-22 ENCOUNTER — Other Ambulatory Visit: Payer: Self-pay

## 2019-04-12 ENCOUNTER — Other Ambulatory Visit: Payer: Self-pay

## 2019-04-12 ENCOUNTER — Encounter (HOSPITAL_COMMUNITY): Payer: Self-pay

## 2019-04-12 ENCOUNTER — Other Ambulatory Visit (HOSPITAL_COMMUNITY): Payer: Self-pay | Admitting: Obstetrics and Gynecology

## 2019-04-12 ENCOUNTER — Ambulatory Visit (HOSPITAL_COMMUNITY)
Admission: RE | Admit: 2019-04-12 | Discharge: 2019-04-12 | Disposition: A | Discharge status: Home / Self Care | Payer: Self-pay | Source: Ambulatory Visit | Attending: Obstetrics and Gynecology | Admitting: Obstetrics and Gynecology

## 2019-04-12 ENCOUNTER — Ambulatory Visit
Admission: RE | Admit: 2019-04-12 | Discharge: 2019-04-12 | Disposition: A | Discharge status: Home / Self Care | Payer: No Typology Code available for payment source | Source: Ambulatory Visit | Attending: Obstetrics and Gynecology | Admitting: Obstetrics and Gynecology

## 2019-04-12 DIAGNOSIS — N6312 Unspecified lump in the right breast, upper inner quadrant: Secondary | ICD-10-CM | POA: Insufficient documentation

## 2019-04-12 DIAGNOSIS — N644 Mastodynia: Secondary | ICD-10-CM

## 2019-04-12 DIAGNOSIS — N6310 Unspecified lump in the right breast, unspecified quadrant: Secondary | ICD-10-CM

## 2019-04-12 DIAGNOSIS — Z1239 Encounter for other screening for malignant neoplasm of breast: Secondary | ICD-10-CM | POA: Insufficient documentation

## 2019-04-12 NOTE — Patient Instructions (Signed)
Explained breast self awareness with Mariel Kansky. Patient did not need a Pap smear today due to last Pap smear was 03/01/2019. Let her know BCCCP will cover Pap smears every 3 years unless has a history of abnormal Pap smears. Referred patient to the [facility] for a right breast ultrasound. Appointment scheduled for Tuesday, April 12, 2019 at 1400. Patient aware of appointment and will be there. Mariel Kansky verbalized understanding.  [place], Arvil Chaco, RN 12:56 PM

## 2019-04-12 NOTE — Progress Notes (Signed)
Complaints of a right breast lump x one month that is painful when touched. Patient rates the pain at a 6 out of 10.  Pap Smear: Pap smear not completed today. Last Pap smear was 8/11/202 at the [facility] and normal. Per patient has no history of an abnormal Pap smear. Last Pap smear result is in Epic.  Physical exam: Breasts Breasts symmetrical. No skin abnormalities bilateral breasts. No nipple retraction bilateral breasts. No nipple discharge bilateral breasts. No lymphadenopathy. No lumps palpated left breast. Palpated a lump within the right breast at 2 o'clock 3 cm from the nipple. Complaints of tenderness when palpated right breast lump. Referred patient to the [facility] for a right breast ultrasound. Appointment scheduled for Tuesday, April 12, 2019 at 1400.        Pelvic/Bimanual No Pap smear completed today since last Pap smear was 03/01/2019. Pap smear not indicated per BCCCP guidelines.   Smoking History: Patient has never smoked.  Patient Navigation: Patient education provided. Access to services provided for patient through [facility] program. Spanish interpreter provided.   Breast and Cervical Cancer Risk Assessment: Patient has no family history of breast cancer, known genetic mutations, or radiation treatment to the chest before age 21. Patient has no history of cervical dysplasia, immunocompromised, or DES exposure in-utero. Breast cancer risk assessment completed. No breast cancer risk calculated due to patient is less than 84 years old.  Used Spanish interpreter [employer] from [place].

## 2019-04-13 ENCOUNTER — Encounter (HOSPITAL_COMMUNITY): Payer: Self-pay | Admitting: *Deleted

## 2019-04-21 ENCOUNTER — Other Ambulatory Visit: Payer: Self-pay

## 2019-04-21 ENCOUNTER — Other Ambulatory Visit (HOSPITAL_COMMUNITY): Payer: Self-pay | Admitting: Obstetrics and Gynecology

## 2019-04-21 ENCOUNTER — Ambulatory Visit
Admission: RE | Admit: 2019-04-21 | Discharge: 2019-04-21 | Disposition: A | Discharge status: Home / Self Care | Payer: No Typology Code available for payment source | Source: Ambulatory Visit | Attending: Obstetrics and Gynecology | Admitting: Obstetrics and Gynecology

## 2019-04-21 DIAGNOSIS — N6310 Unspecified lump in the right breast, unspecified quadrant: Secondary | ICD-10-CM
